# Patient Record
Sex: Female | Born: 2000 | Hispanic: No | Marital: Single | Smoking: Never smoker
Health system: Southern US, Community
[De-identification: ages and names within clinical notes are randomized; demographics above are authoritative.]

---
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
## 2018-10-29 IMAGING — MR MR HEAD WO/W CM
10 series · 48 of 48 positions shown · IV contrast (multihance)
Comparison: None.

CLINICAL DATA: Headaches and dizziness. Numbness and tingling.
Altered sensorium.

EXAM:
MRI HEAD WITHOUT AND WITH CONTRAST
TECHNIQUE: Multiplanar, multiecho pulse sequences of the brain and surrounding
structures were obtained without and with intravenous contrast.
CONTRAST:  15mL MULTIHANCE GADOBENATE DIMEGLUMINE 529 MG/ML IV SOLN

[Series 2: T1 · sagittal · 5.0mm · 0.45mm/px · 2 of 21 slices shown]
[im 1/21]
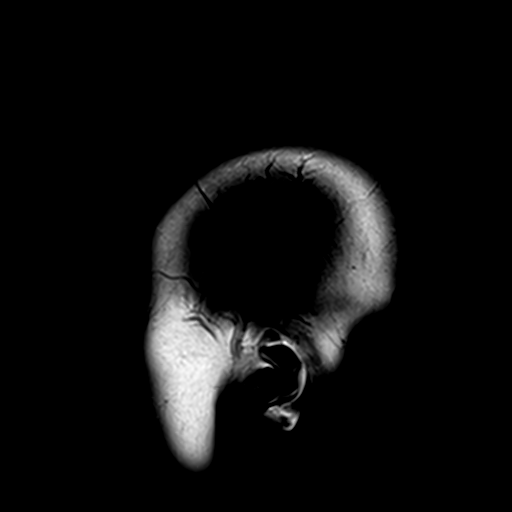
[im 21/21]
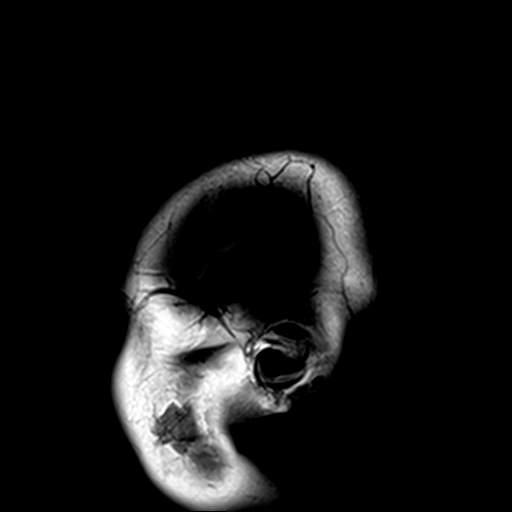

[Series 3: DWI · axial · 3.0mm · 1.80mm/px · z∈[-15,+131]mm · 8 of 100 slices shown (1 of 2)]
[im 1/100]
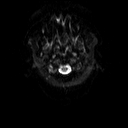
[im 15/100]
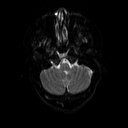
[im 29/100]
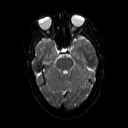
[im 43/100]
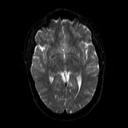
[im 57/100]
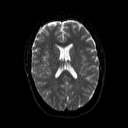
[im 71/100]
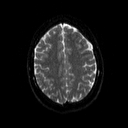
[im 85/100]
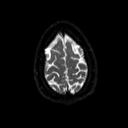
[im 100/100]
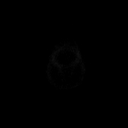

[Series 4: DWI · axial · 3.0mm · 1.80mm/px · z∈[-15,+131]mm · 4 of 49 slices shown (2 of 2)]
[im 1/49]
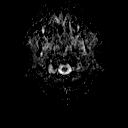
[im 17/49]
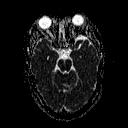
[im 33/49]
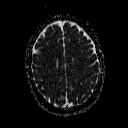
[im 49/49]
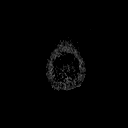

[Series 5: T2 · axial · 5.0mm · 0.51mm/px · z∈[-11,+131]mm · 2 of 22 slices shown (1 of 2)]
[im 1/22]
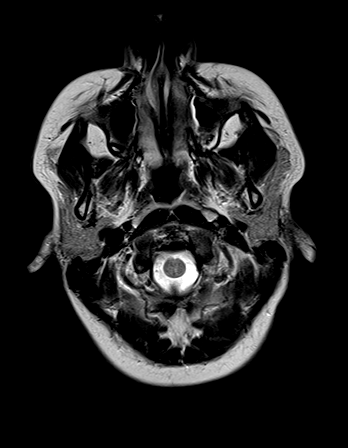
[im 22/22]
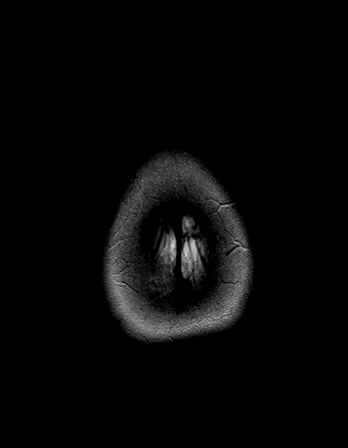

[Series 6: FLAIR · axial · 3.0mm · 0.45mm/px · z∈[-36,+113]mm · 2 of 26 slices shown]
[im 1/26]
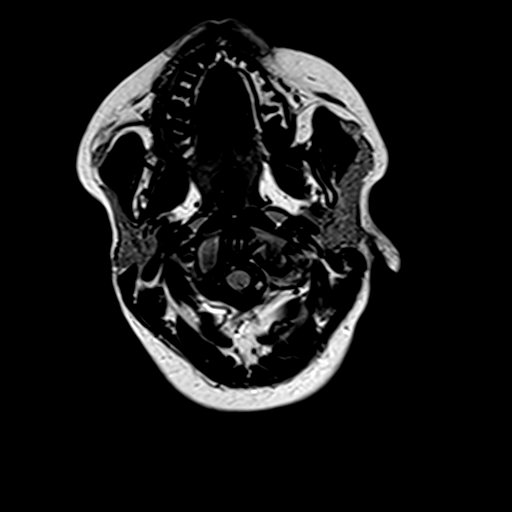
[im 26/26]
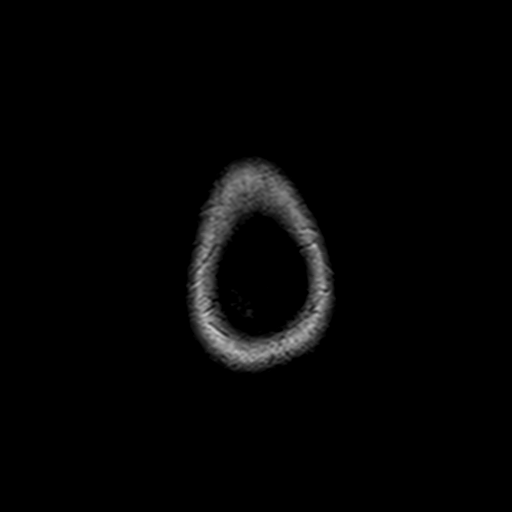

[Series 8: swi_images · axial · 5.0mm · 0.90mm/px · z∈[-32,+112]mm · 2 of 30 slices shown]
[im 1/30]
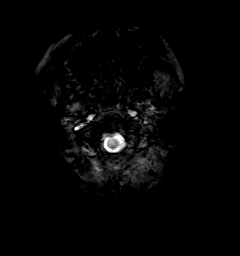
[im 30/30]
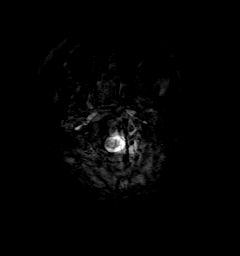

[Series 10: t1_mpr_tra · axial · 1.0mm · 0.75mm/px · z∈[-21,+121]mm · 12 of 144 slices shown (1 of 2)]
[im 1/144]
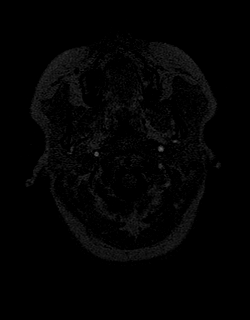
[im 14/144]
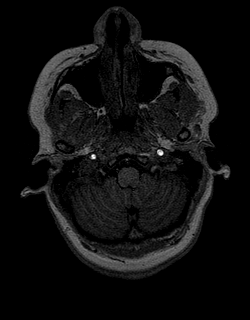
[im 27/144]
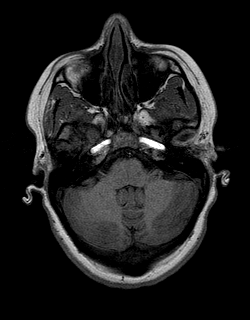
[im 40/144]
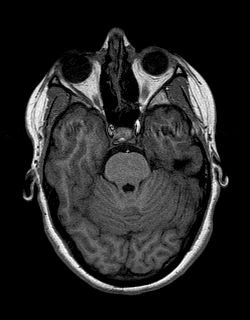
[im 53/144]
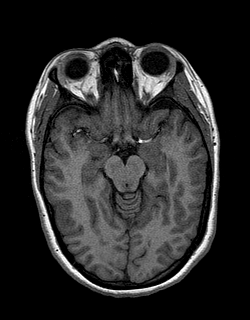
[im 66/144]
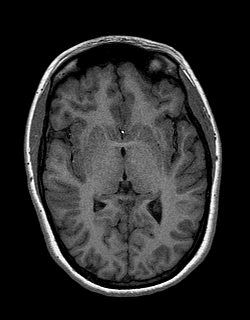
[im 79/144]
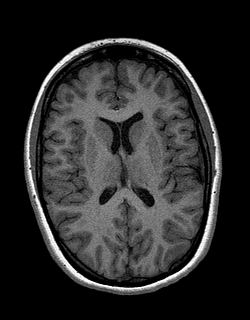
[im 92/144]
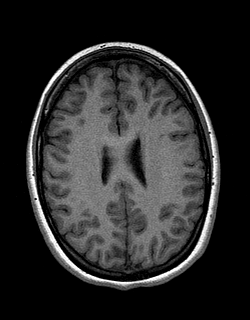
[im 105/144]
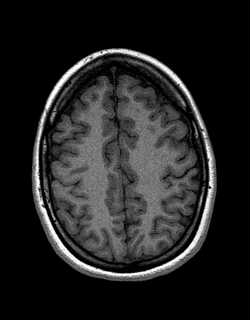
[im 118/144]
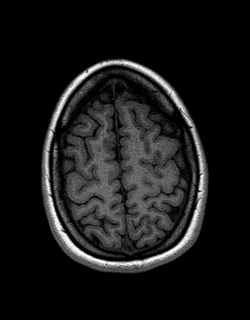
[im 131/144]
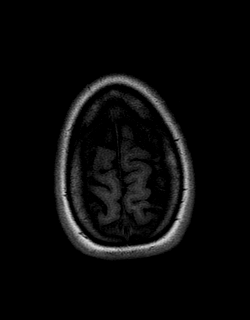
[im 144/144]
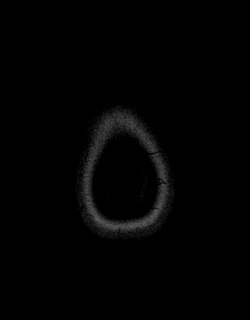

[Series 11: T2 · coronal · 5.0mm · 0.45mm/px · 2 of 27 slices shown (2 of 2)]
[im 1/27]
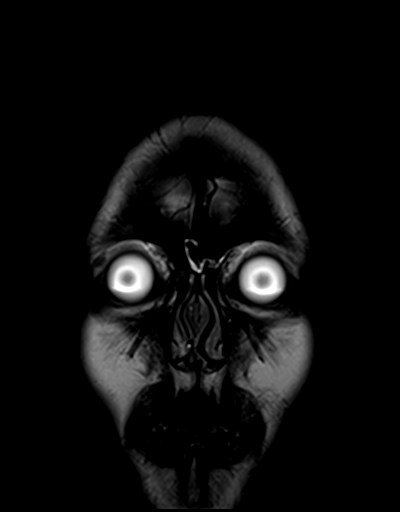
[im 27/27]
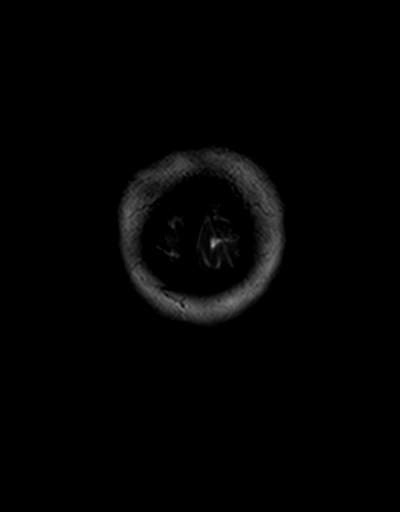

[Series 12: t1_mpr_tra · axial · 1.0mm · 0.75mm/px · z∈[-21,+121]mm · 12 of 144 slices shown (2 of 2)]
[im 1/144]
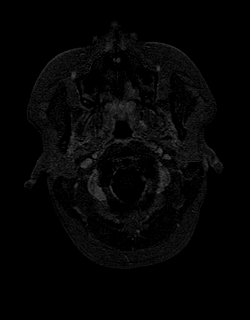
[im 14/144]
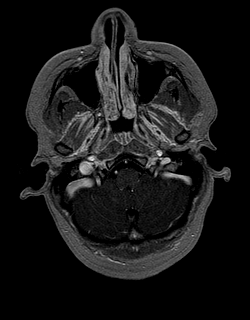
[im 27/144]
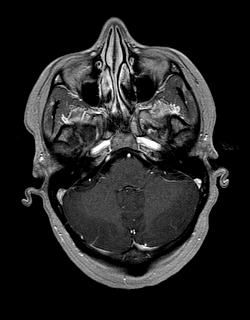
[im 40/144]
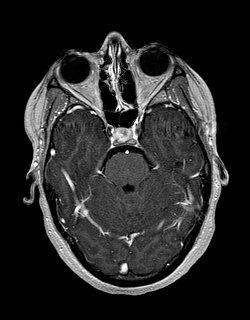
[im 53/144]
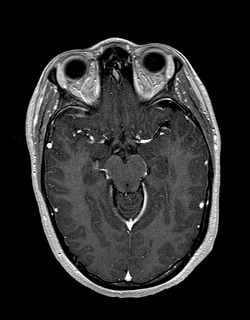
[im 66/144]
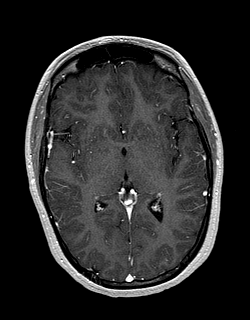
[im 79/144]
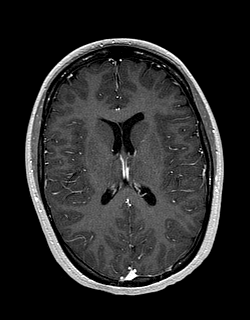
[im 92/144]
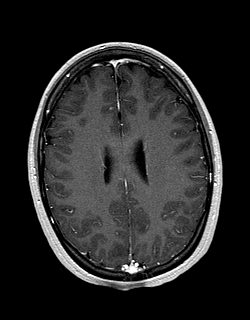
[im 105/144]
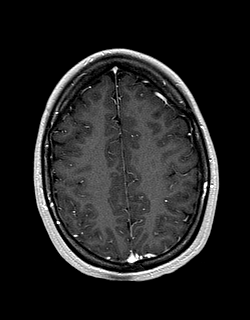
[im 118/144]
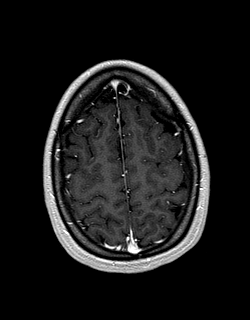
[im 131/144]
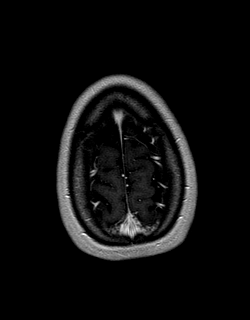
[im 144/144]
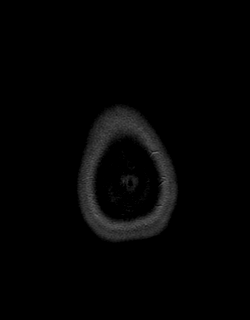

[Series 13: post cor · coronal · 5.0mm · 0.45mm/px · 2 of 27 slices shown]
[im 1/27]
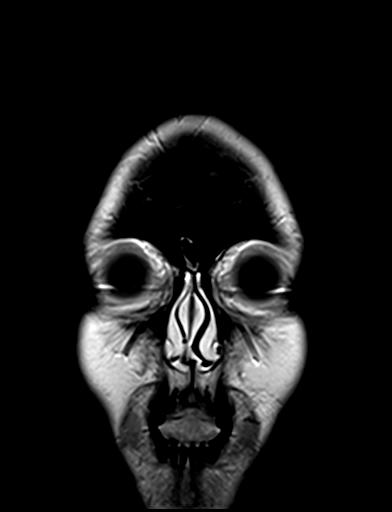
[im 27/27]
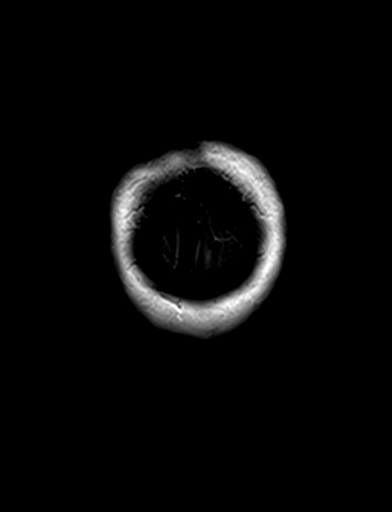

[48 of 48 positions shown; findings below may reference images not displayed]

FINDINGS: Brain: Scattered anterior frontal lobe subcortical T2
hyperintensities are noted bilaterally. There is no associated
enhancement or restricted diffusion. No other significant white
matter disease is present. No significant periventricular disease is
evident

No acute infarct or hemorrhage is present. There is no mass lesion.
No pathologic enhancement is present. The internal auditory canals
are within normal limits bilaterally. The brainstem and cerebellum
are normal.

Vascular: Flow is present in the major intracranial arteries.

Skull and upper cervical spine: The skullbase is within normal
limits. Midline sagittal structures are unremarkable.

Sinuses/Orbits: Mild mucosal thickening is present in the left
maxillary sinus and left greater than right anterior ethmoid and
frontal air cells. There is asymmetric mucosal thickening the left
sphenoid sinus. No focal fluid levels are present. The mastoid air
cells are clear.
IMPRESSION: 1. Scattered anterior frontal lobe subcortical T2 hyperintensities
bilaterally. The finding is nonspecific but can be seen in the
setting of chronic microvascular ischemia, a demyelinating process
such as multiple sclerosis, vasculitis, complicated migraine
headaches, or as the sequelae of a prior infectious or inflammatory
process.
2. No other acute or focal abnormality.
3. Minimal sinus disease.
